# Patient Record
Sex: Male | Born: 1950 | Race: Asian | Marital: Married | Smoking: Never smoker
Health system: Southern US, Community
[De-identification: ages and names within clinical notes are randomized; demographics above are authoritative.]

## PROBLEM LIST (undated history)

## (undated) DIAGNOSIS — I82409 Acute embolism and thrombosis of unspecified deep veins of unspecified lower extremity: Secondary | ICD-10-CM

## (undated) DIAGNOSIS — B1910 Unspecified viral hepatitis B without hepatic coma: Secondary | ICD-10-CM

## (undated) DIAGNOSIS — K769 Liver disease, unspecified: Secondary | ICD-10-CM

## (undated) DIAGNOSIS — E119 Type 2 diabetes mellitus without complications: Secondary | ICD-10-CM

## (undated) DIAGNOSIS — E079 Disorder of thyroid, unspecified: Secondary | ICD-10-CM

## (undated) DIAGNOSIS — I10 Essential (primary) hypertension: Secondary | ICD-10-CM

## (undated) HISTORY — DX: Essential (primary) hypertension: I10

## (undated) HISTORY — PX: THORACENTESIS: SHX235

## (undated) HISTORY — DX: Acute embolism and thrombosis of unspecified deep veins of unspecified lower extremity: I82.409

---

## 2007-12-24 ENCOUNTER — Inpatient Hospital Stay: Payer: Self-pay | Admitting: Endocrinology

## 2012-12-15 ENCOUNTER — Emergency Department: Payer: Self-pay | Admitting: Emergency Medicine

## 2012-12-15 LAB — CBC WITH DIFFERENTIAL/PLATELET
Basophil #: 0.1 10*3/uL (ref 0.0–0.1)
Eosinophil #: 0.1 10*3/uL (ref 0.0–0.7)
HCT: 45.1 % (ref 40.0–52.0)
Lymphocyte #: 1.8 10*3/uL (ref 1.0–3.6)
Lymphocyte %: 22.4 %
MCV: 83 fL (ref 80–100)
Monocyte #: 0.4 x10 3/mm (ref 0.2–1.0)
Monocyte %: 5 %
Neutrophil %: 71.1 %
RBC: 5.41 10*6/uL (ref 4.40–5.90)
RDW: 14.2 % (ref 11.5–14.5)

## 2012-12-15 LAB — COMPREHENSIVE METABOLIC PANEL
Albumin: 3.5 g/dL (ref 3.4–5.0)
Alkaline Phosphatase: 63 U/L
Anion Gap: 4 — ABNORMAL LOW (ref 7–16)
BUN: 23 mg/dL — ABNORMAL HIGH (ref 7–18)
Bilirubin,Total: 1.1 mg/dL — ABNORMAL HIGH (ref 0.2–1.0)
Calcium, Total: 9.1 mg/dL (ref 8.5–10.1)
EGFR (African American): >60
Osmolality: 286 (ref 275–301)
Potassium: 4.3 mmol/L (ref 3.5–5.1)
SGOT(AST): 51 U/L — ABNORMAL HIGH (ref 15–37)
Sodium: 138 mmol/L (ref 136–145)

## 2012-12-15 LAB — TROPONIN I
Troponin-I: <0.02 ng/mL
Troponin-I: <0.02 ng/mL

## 2018-06-22 ENCOUNTER — Other Ambulatory Visit: Payer: Self-pay | Admitting: Family Medicine

## 2018-06-22 DIAGNOSIS — R600 Localized edema: Secondary | ICD-10-CM

## 2018-06-26 ENCOUNTER — Ambulatory Visit
Admission: RE | Admit: 2018-06-26 | Discharge: 2018-06-26 | Disposition: A | Discharge status: Home / Self Care | Payer: Medicare HMO | Source: Ambulatory Visit | Attending: Family Medicine | Admitting: Family Medicine

## 2018-06-26 ENCOUNTER — Encounter (INDEPENDENT_AMBULATORY_CARE_PROVIDER_SITE_OTHER): Payer: Self-pay

## 2018-06-26 ENCOUNTER — Other Ambulatory Visit: Payer: Self-pay

## 2018-06-26 DIAGNOSIS — R600 Localized edema: Secondary | ICD-10-CM | POA: Insufficient documentation

## 2018-09-18 ENCOUNTER — Other Ambulatory Visit: Payer: Self-pay | Admitting: Family Medicine

## 2018-09-18 DIAGNOSIS — R600 Localized edema: Secondary | ICD-10-CM

## 2018-09-27 ENCOUNTER — Other Ambulatory Visit: Payer: Self-pay

## 2018-09-27 ENCOUNTER — Ambulatory Visit
Admission: RE | Admit: 2018-09-27 | Discharge: 2018-09-27 | Disposition: A | Discharge status: Home / Self Care | Payer: Medicare HMO | Source: Ambulatory Visit | Attending: Family Medicine | Admitting: Family Medicine

## 2018-09-27 ENCOUNTER — Encounter (INDEPENDENT_AMBULATORY_CARE_PROVIDER_SITE_OTHER): Payer: Self-pay

## 2018-09-27 DIAGNOSIS — R600 Localized edema: Secondary | ICD-10-CM

## 2018-12-21 ENCOUNTER — Other Ambulatory Visit (HOSPITAL_COMMUNITY): Payer: Self-pay | Admitting: Family Medicine

## 2018-12-21 ENCOUNTER — Other Ambulatory Visit: Payer: Self-pay | Admitting: Family Medicine

## 2018-12-21 DIAGNOSIS — Z86718 Personal history of other venous thrombosis and embolism: Secondary | ICD-10-CM

## 2018-12-31 ENCOUNTER — Encounter (INDEPENDENT_AMBULATORY_CARE_PROVIDER_SITE_OTHER): Payer: Self-pay

## 2018-12-31 ENCOUNTER — Other Ambulatory Visit: Payer: Self-pay

## 2018-12-31 ENCOUNTER — Ambulatory Visit
Admission: RE | Admit: 2018-12-31 | Discharge: 2018-12-31 | Disposition: A | Discharge status: Home / Self Care | Payer: Medicare HMO | Source: Ambulatory Visit | Attending: Family Medicine | Admitting: Family Medicine

## 2018-12-31 DIAGNOSIS — Z86718 Personal history of other venous thrombosis and embolism: Secondary | ICD-10-CM | POA: Insufficient documentation

## 2019-06-11 ENCOUNTER — Other Ambulatory Visit: Payer: Self-pay | Admitting: Family Medicine

## 2019-06-11 ENCOUNTER — Other Ambulatory Visit: Payer: Self-pay

## 2019-06-11 ENCOUNTER — Ambulatory Visit
Admission: RE | Admit: 2019-06-11 | Discharge: 2019-06-11 | Disposition: A | Discharge status: Home / Self Care | Payer: Medicare PPO | Source: Ambulatory Visit | Attending: Family Medicine | Admitting: Family Medicine

## 2019-06-11 DIAGNOSIS — R600 Localized edema: Secondary | ICD-10-CM

## 2019-06-24 ENCOUNTER — Encounter: Payer: Self-pay | Admitting: Emergency Medicine

## 2019-06-24 ENCOUNTER — Other Ambulatory Visit: Payer: Self-pay

## 2019-06-24 ENCOUNTER — Emergency Department
Admission: EM | Admit: 2019-06-24 | Discharge: 2019-06-24 | Disposition: A | Discharge status: Home / Self Care | Payer: Medicare PPO | Attending: Emergency Medicine | Admitting: Emergency Medicine

## 2019-06-24 ENCOUNTER — Emergency Department: Payer: Medicare PPO

## 2019-06-24 DIAGNOSIS — R0602 Shortness of breath: Secondary | ICD-10-CM | POA: Insufficient documentation

## 2019-06-24 DIAGNOSIS — Z5321 Procedure and treatment not carried out due to patient leaving prior to being seen by health care provider: Secondary | ICD-10-CM | POA: Insufficient documentation

## 2019-06-24 LAB — COMPREHENSIVE METABOLIC PANEL
ALT: 76 U/L — ABNORMAL HIGH (ref 0–44)
AST: 91 U/L — ABNORMAL HIGH (ref 15–41)
Albumin: 3.5 g/dL (ref 3.5–5.0)
Alkaline Phosphatase: 103 U/L (ref 38–126)
Anion gap: 9 (ref 5–15)
BUN: 24 mg/dL — ABNORMAL HIGH (ref 8–23)
CO2: 28 mmol/L (ref 22–32)
Calcium: 8.8 mg/dL — ABNORMAL LOW (ref 8.9–10.3)
Chloride: 99 mmol/L (ref 98–111)
Creatinine, Ser: 1.01 mg/dL (ref 0.61–1.24)
GFR calc Af Amer: >60 mL/min (ref >60)
GFR calc non Af Amer: >60 mL/min (ref >60)
Glucose, Bld: 102 mg/dL — ABNORMAL HIGH (ref 70–99)
Potassium: 4 mmol/L (ref 3.5–5.1)
Sodium: 136 mmol/L (ref 135–145)
Total Bilirubin: 1.6 mg/dL — ABNORMAL HIGH (ref 0.3–1.2)
Total Protein: 7.8 g/dL (ref 6.5–8.1)

## 2019-06-24 LAB — CBC
HCT: 40.6 % (ref 39.0–52.0)
Hemoglobin: 13.5 g/dL (ref 13.0–17.0)
MCH: 27.2 pg (ref 26.0–34.0)
MCHC: 33.3 g/dL (ref 30.0–36.0)
MCV: 81.7 fL (ref 80.0–100.0)
Platelets: 242 10*3/uL (ref 150–400)
RBC: 4.97 MIL/uL (ref 4.22–5.81)
RDW: 14.8 % (ref 11.5–15.5)
WBC: 6.1 10*3/uL (ref 4.0–10.5)
nRBC: 0 % (ref 0.0–0.2)

## 2019-06-24 LAB — BRAIN NATRIURETIC PEPTIDE: B Natriuretic Peptide: 116.3 pg/mL — ABNORMAL HIGH (ref 0.0–100.0)

## 2019-06-24 NOTE — ED Notes (Signed)
No answer when called from lobby x1 

## 2019-06-24 NOTE — ED Notes (Signed)
No answer when called from lobby x3. 

## 2019-06-24 NOTE — ED Triage Notes (Signed)
Patient presents to the ED with shortness of breath on exertion x 1 week.  Patient states he was started on Eliquis and Vimlidy around the same time the symptoms started due to a "possible" blood clot to patient's left leg.

## 2019-06-24 NOTE — ED Notes (Signed)
No answer when called from lobby x2. 

## 2019-06-26 ENCOUNTER — Telehealth: Payer: Self-pay | Admitting: Emergency Medicine

## 2019-06-26 NOTE — Telephone Encounter (Signed)
Called patient due to lwot to inquire about condition and follow up plans. Says she has called her doctor and will follow up.

## 2019-09-12 ENCOUNTER — Other Ambulatory Visit: Payer: Self-pay

## 2019-09-12 ENCOUNTER — Encounter: Payer: Self-pay | Admitting: Emergency Medicine

## 2019-09-12 DIAGNOSIS — T85631A Leakage of intraperitoneal dialysis catheter, initial encounter: Secondary | ICD-10-CM | POA: Insufficient documentation

## 2019-09-12 DIAGNOSIS — K649 Unspecified hemorrhoids: Secondary | ICD-10-CM | POA: Diagnosis present

## 2019-09-12 DIAGNOSIS — Y69 Unspecified misadventure during surgical and medical care: Secondary | ICD-10-CM | POA: Insufficient documentation

## 2019-09-12 DIAGNOSIS — I10 Essential (primary) hypertension: Secondary | ICD-10-CM | POA: Insufficient documentation

## 2019-09-12 LAB — COMPREHENSIVE METABOLIC PANEL
ALT: 60 U/L — ABNORMAL HIGH (ref 0–44)
AST: 79 U/L — ABNORMAL HIGH (ref 15–41)
Albumin: 3.7 g/dL (ref 3.5–5.0)
Alkaline Phosphatase: 109 U/L (ref 38–126)
Anion gap: 11 (ref 5–15)
BUN: 30 mg/dL — ABNORMAL HIGH (ref 8–23)
CO2: 29 mmol/L (ref 22–32)
Calcium: 9 mg/dL (ref 8.9–10.3)
Chloride: 89 mmol/L — ABNORMAL LOW (ref 98–111)
Creatinine, Ser: 1 mg/dL (ref 0.61–1.24)
GFR calc Af Amer: >60 mL/min (ref >60)
GFR calc non Af Amer: >60 mL/min (ref >60)
Glucose, Bld: 341 mg/dL — ABNORMAL HIGH (ref 70–99)
Potassium: 4 mmol/L (ref 3.5–5.1)
Sodium: 129 mmol/L — ABNORMAL LOW (ref 135–145)
Total Bilirubin: 1.6 mg/dL — ABNORMAL HIGH (ref 0.3–1.2)
Total Protein: 8.1 g/dL (ref 6.5–8.1)

## 2019-09-12 LAB — TYPE AND SCREEN
ABO/RH(D): AB POS
Antibody Screen: NEGATIVE

## 2019-09-12 LAB — CBC
HCT: 35.4 % — ABNORMAL LOW (ref 39.0–52.0)
Hemoglobin: 12.3 g/dL — ABNORMAL LOW (ref 13.0–17.0)
MCH: 27.8 pg (ref 26.0–34.0)
MCHC: 34.7 g/dL (ref 30.0–36.0)
MCV: 79.9 fL — ABNORMAL LOW (ref 80.0–100.0)
Platelets: 228 10*3/uL (ref 150–400)
RBC: 4.43 MIL/uL (ref 4.22–5.81)
RDW: 16.1 % — ABNORMAL HIGH (ref 11.5–15.5)
WBC: 7.1 10*3/uL (ref 4.0–10.5)
nRBC: 0 % (ref 0.0–0.2)

## 2019-09-12 NOTE — ED Triage Notes (Signed)
Pt in via EMS from home with c/o bright red blood in his BM. Pt with ESRD. Pt had parecntesis on Tuesday and has had leakage from it for 48 hours  127/69 98 HR  100%RA FSBS 354 by home reading and took 8 units of insulin prior to leaving the scene

## 2019-09-12 NOTE — ED Triage Notes (Addendum)
Pt from home via [facility]. Per EMS, pt st going to the bathroom today and noticed bright red blood in his stool. St having a paracentesis on Tuesday 9/7.   St having same procedure about 3 times but this time he noticed a constant leakage noted from left mid abd, st calling PCP and told leakage should go away within 48hrs.  Pt on eloquis.   Pt denies Cp/SHOB/dizziness at this time.  NAD noted at this time.

## 2019-09-13 ENCOUNTER — Emergency Department
Admission: EM | Admit: 2019-09-13 | Discharge: 2019-09-13 | Disposition: A | Discharge status: Home / Self Care | Payer: Medicare PPO | Attending: Emergency Medicine | Admitting: Emergency Medicine

## 2019-09-13 DIAGNOSIS — K649 Unspecified hemorrhoids: Secondary | ICD-10-CM

## 2019-09-13 LAB — GLUCOSE, CAPILLARY: Glucose-Capillary: 302 mg/dL — ABNORMAL HIGH (ref 70–99)

## 2019-09-13 MED ORDER — LIDOCAINE HCL (PF) 1 % IJ SOLN
INTRAMUSCULAR | Status: AC
  Filled 2019-09-13: qty 5

## 2019-09-13 NOTE — ED Provider Notes (Signed)
[facility] Emergency Department Provider Note  ____________________________________________   First MD Initiated Contact with Patient 09/13/19 0127     (approximate)  I have reviewed the triage vital signs and the nursing notes.   HISTORY  Chief Complaint GI Bleeding (Leakage from paracentesis)    HPI Keith Salazar is a 69 y.o. male with below list of previous medical conditions including hypertension DVT end-stage renal disease recent paracentesis on Tuesday presents to the emergency department secondary to 2 complaints.  Patient states that he has had persistent leakage of fluid from the site of his paracentesis and episode of bright red blood per rectum with defecation today.  Patient denies any abdominal discomfort.  Patient denies any fever afebrile on presentation.        Past Medical History:  Diagnosis Date  . DVT (deep venous thrombosis) (HCC)   . Hypertension     There are no problems to display for this patient.   History reviewed. No pertinent surgical history.  Prior to Admission medications   Not on File    Allergies Patient has no known allergies.  History reviewed. No pertinent family history.  Social History Social History   Tobacco Use  . Smoking status: Never Smoker  . Smokeless tobacco: Never Used  Vaping Use  . Vaping Use: Never used  Substance Use Topics  . Alcohol use: Not Currently  . Drug use: Not on file    Review of Systems Constitutional: No fever/chills Eyes: No visual changes. ENT: No sore throat. Cardiovascular: Denies chest pain. Respiratory: Denies shortness of breath. Gastrointestinal: No abdominal pain.  No nausea, no vomiting.  No diarrhea.  No constipation. Genitourinary: Negative for dysuria. Musculoskeletal: Negative for neck pain.  Negative for back pain. Integumentary: Negative for rash. Neurological: Negative for headaches, focal weakness or  numbness.  ____________________________________________   PHYSICAL EXAM:  VITAL SIGNS: ED Triage Vitals  Enc Vitals Group     BP 09/12/19 1812 112/71     Pulse Rate 09/12/19 1812 89     Resp 09/12/19 1812 18     Temp 09/12/19 1812 98 F (36.7 C)     Temp Source 09/12/19 1812 Oral     SpO2 09/12/19 1812 100 %     Weight 09/12/19 1813 56.7 kg (125 lb)     Height 09/12/19 1813 1.676 m (5\' 6" )     Head Circumference --      Peak Flow --      Pain Score 09/12/19 1813 0     Pain Loc --      Pain Edu? --      Excl. in GC? --     Constitutional: Alert and oriented.  Eyes: Conjunctivae are normal.  Head: Atraumatic. Mouth/Throat: Patient is wearing a mask. Neck: No stridor.  No meningeal signs.   Cardiovascular: Normal rate, regular rhythm. Good peripheral circulation. Grossly normal heart sounds. Respiratory: Normal respiratory effort.  No retractions. Gastrointestinal: Soft and nontender. No distention.  Small puncture site noted left lower quadrant with ascites fluid draining slowly from site Rectal: Hemorrhoids noted at 5 and 7 o'clock position with evidence of bleeding from the 5 o'clock position hemorrhoid Musculoskeletal: No lower extremity tenderness nor edema. No gross deformities of extremities. Neurologic:  Normal speech and language. No gross focal neurologic deficits are appreciated.  Skin:  Skin is warm, dry and intact. Psychiatric: Mood and affect are normal. Speech and behavior are normal.  ____________________________________________   LABS (all labs ordered are listed,  but only abnormal results are displayed)  Labs Reviewed  COMPREHENSIVE METABOLIC PANEL - Abnormal; Notable for the following components:      Result Value   Sodium 129 (*)    Chloride 89 (*)    Glucose, Bld 341 (*)    BUN 30 (*)    AST 79 (*)    ALT 60 (*)    Total Bilirubin 1.6 (*)    All other components within normal limits  CBC - Abnormal; Notable for the following components:    Hemoglobin 12.3 (*)    HCT 35.4 (*)    MCV 79.9 (*)    RDW 16.1 (*)    All other components within normal limits  GLUCOSE, CAPILLARY - Abnormal; Notable for the following components:   Glucose-Capillary 302 (*)    All other components within normal limits  POC OCCULT BLOOD, ED  TYPE AND SCREEN   ____________________________________________   .Marland KitchenLaceration Repair  Date/Time: 09/13/2019 4:19 AM Performed by: Darci Current, MD Authorized by: Darci Current, MD   Consent:    Consent obtained:  Verbal   Consent given by:  Patient   Risks discussed:  Infection, pain, retained foreign body, poor cosmetic result and poor wound healing Anesthesia (see MAR for exact dosages):    Anesthesia method:  Local infiltration   Local anesthetic:  Lidocaine 1% w/o epi Laceration details:    Location:  Trunk   Trunk location:  LLQ abd   Length (cm):  1 Repair type:    Repair type:  Simple Exploration:    Hemostasis achieved with:  Direct pressure   Wound exploration: entire depth of wound probed and visualized     Contaminated: no   Treatment:    Area cleansed with:  Saline   Amount of cleaning:  Extensive   Irrigation solution:  Sterile saline   Visualized foreign bodies/material removed: no   Skin repair:    Repair method:  Sutures   Suture size:  6-0   Suture technique:  Simple interrupted Approximation:    Approximation:  Close Post-procedure details:    Dressing:  Sterile dressing   Patient tolerance of procedure:  Tolerated well, no immediate complications     ____________________________________________   INITIAL IMPRESSION / MDM / ASSESSMENT AND PLAN / ED COURSE  As part of my medical decision making, I reviewed the following data within the electronic MEDICAL RECORD NUMBER   69 year old male presented with above-stated history and physical exam consistent with external hemorrhoid.  Regarding the patient's continuous drip leakage of ascites fluid from recent  paracentesis site.  Attempted to glue the area however leakage continued.  As such to stitches were placed with complete resolution of leakage of fluid. ____________________________________________  FINAL CLINICAL IMPRESSION(S) / ED DIAGNOSES  Final diagnoses:  Hemorrhoids, unspecified hemorrhoid type  Wound repair   MEDICATIONS GIVEN DURING THIS VISIT:  Medications - No data to display   ED Discharge Orders    None      *Please note:  Posey Mish was evaluated in Emergency Department on 09/13/2019 for the symptoms described in the history of present illness. He was evaluated in the context of the global COVID-19 pandemic, which necessitated consideration that the patient might be at risk for infection with the SARS-CoV-2 virus that causes COVID-19. Institutional protocols and algorithms that pertain to the evaluation of patients at risk for COVID-19 are in a state of rapid change based on information released by regulatory bodies including the CDC and federal and  state organizations. These policies and algorithms were followed during the patient's care in the ED.  Some ED evaluations and interventions may be delayed as a result of limited staffing during and after the pandemic.*  Note:  This document was prepared using Dragon voice recognition software and may include unintentional dictation errors.   Darci Current, MD 09/13/19 [phone]

## 2021-02-25 ENCOUNTER — Ambulatory Visit
Admission: EM | Admit: 2021-02-25 | Discharge: 2021-02-25 | Disposition: A | Discharge status: Home / Self Care | Payer: Medicare HMO | Attending: Emergency Medicine | Admitting: Emergency Medicine

## 2021-02-25 ENCOUNTER — Ambulatory Visit (INDEPENDENT_AMBULATORY_CARE_PROVIDER_SITE_OTHER): Payer: Medicare HMO

## 2021-02-25 ENCOUNTER — Other Ambulatory Visit: Payer: Self-pay

## 2021-02-25 DIAGNOSIS — R079 Chest pain, unspecified: Secondary | ICD-10-CM

## 2021-02-25 DIAGNOSIS — R0600 Dyspnea, unspecified: Secondary | ICD-10-CM | POA: Diagnosis not present

## 2021-02-25 DIAGNOSIS — R1084 Generalized abdominal pain: Secondary | ICD-10-CM

## 2021-02-25 DIAGNOSIS — R0602 Shortness of breath: Secondary | ICD-10-CM

## 2021-02-25 HISTORY — DX: Type 2 diabetes mellitus without complications: E11.9

## 2021-02-25 HISTORY — DX: Unspecified viral hepatitis B without hepatic coma: B19.10

## 2021-02-25 HISTORY — DX: Liver disease, unspecified: K76.9

## 2021-02-25 HISTORY — DX: Disorder of thyroid, unspecified: E07.9

## 2021-02-25 NOTE — ED Triage Notes (Signed)
Patient presents to UC for SOB since this morning. Abdominal pain x 2 weeks. Pt states he has a hx of DVT on eliquis. He reports swelling in abdomen. Last thoracentesis in April 2022.  Denies chest pain or arm pain.

## 2021-02-25 NOTE — Discharge Instructions (Addendum)
Expressed to patient he should follow-up and call Juel Burrow today upon discharge or he will need to be seen in the emergency room.  There are concerns that fluid is accumulating to the abdominal area. Chest x-ray is negative.  Patient is stable at this time but does need further evaluation and a work-up completed with labs. Patient can speak in full sentences and is not in distress or shortness of breath for him to contact the provider.  Patient understands the discharge instructions and will need to be seen in the emergency room if symptoms become worse. Final Clinical Impressions(s) / UC Diagnoses

## 2021-02-25 NOTE — ED Provider Notes (Addendum)
[facility]    CSN: [id number] Arrival date & time: 02/25/21  1039      History   Chief Complaint Chief Complaint  Patient presents with   Abdominal Pain   Shortness of Breath    HPI Keith Salazar is a 71 y.o. male.   Patient presents today with left-sided chest pain and some shortness of breath upon exertion.  Has had some abdominal pain and distention for 2 weeks now.  Patient has a history of hepatitis B and has a shunt placed for fluid.  Has not had paracentesis done since May of last year.  Patient denies any leg swelling.  Denies any fevers no nausea vomiting or diarrhea.  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  Patient is on blood thinners at this time.   Past Medical History:  Diagnosis Date   Diabetes mellitus, type 2 (HCC)    DVT (deep venous thrombosis) (HCC)    Hepatitis B    Hypertension    Liver disease    Thyroid disease     There are no problems to display for this patient.   Past Surgical History:  Procedure Laterality Date   THORACENTESIS         Home Medications    Prior to Admission medications   Medication Sig Start Date End Date Taking? Authorizing Provider  insulin aspart (NOVOLOG) 100 UNIT/ML FlexPen Inject into the skin. 01/07/21 06/16/21 Yes [provider]  ELIQUIS 5 MG TABS tablet Take 5 mg by mouth 2 (two) times daily. 12/24/20   [provider]  furosemide (LASIX) 40 MG tablet Take 40 mg by mouth daily. 12/10/20   [provider]  glipiZIDE (GLUCOTROL) 5 MG tablet Take 5 mg by mouth every morning. 01/30/21   [provider]  lactulose (CHRONULAC) 10 GM/15ML solution Take 20 g by mouth daily. 01/04/21   [provider]  levothyroxine (SYNTHROID) 50 MCG tablet Take 50 mcg by mouth daily. 01/29/21   [provider]  spironolactone (ALDACTONE) 50 MG tablet Take 50 mg by mouth daily. 01/30/21   [provider]  thiamine 100 MG tablet Take by mouth.    [provider]   TRADJENTA 5 MG TABS tablet Take 5 mg by mouth daily. 02/20/21   [provider]  ursodiol (ACTIGALL) 250 MG tablet Take by mouth. 12/17/20   [provider]  VEMLIDY 25 MG TABS Take 1 tablet by mouth daily. 02/03/21   [provider]    Family History History reviewed. No pertinent family history.  Social History Social History   Tobacco Use   Smoking status: Never   Smokeless tobacco: Never  Vaping Use   Vaping Use: Never used  Substance Use Topics   Alcohol use: Not Currently   Drug use: Never     Allergies   Patient has no known allergies.   Review of Systems Review of Systems  Constitutional:  Positive for fatigue. Negative for fever.  HENT: Negative.    Respiratory:  Positive for shortness of breath. Negative for cough and wheezing.   Cardiovascular:  Positive for chest pain. Negative for palpitations and leg swelling.  Gastrointestinal:  Positive for abdominal distention and abdominal pain. Negative for constipation, nausea and vomiting.  Endocrine: Negative.   Genitourinary: Negative.   Skin: Negative.   Neurological: Negative.     Physical Exam Triage Vital Signs ED Triage Vitals  Enc Vitals Group     BP 02/25/21 1050 (!) 142/79  Pulse Rate 02/25/21 1050 78     Resp 02/25/21 1050 16     Temp 02/25/21 1050 98.3 F (36.8 C)     Temp Source 02/25/21 1050 Oral     SpO2 02/25/21 1050 99 %     Weight --      Height --      Head Circumference --      Peak Flow --      Pain Score 02/25/21 1100 0     Pain Loc --      Pain Edu? --      Excl. in GC? --    No data found.  Updated Vital Signs BP (!) 142/79 (BP Location: Right Arm)    Pulse 78    Temp 98.3 F (36.8 C) (Oral)    Resp 16    SpO2 99%   Visual Acuity Right Eye Distance:   Left Eye Distance:   Bilateral Distance:    Right Eye Near:   Left Eye Near:    Bilateral Near:     Physical Exam Constitutional:      Appearance: He is well-developed and normal weight.   Cardiovascular:     Rate and Rhythm: Normal rate.  Pulmonary:     Effort: Pulmonary effort is normal.     Breath sounds: Normal breath sounds.  Abdominal:     General: Bowel sounds are normal. There is distension.     Palpations: Abdomen is soft.     Tenderness: There is generalized abdominal tenderness. There is no right CVA tenderness or left CVA tenderness.     Comments: Abdomen slight distention.  Soft to palpation may be concerns for fluid to abdomen.  Patient unsure of baseline  Skin:    General: Skin is warm.     Capillary Refill: Capillary refill takes 2 to 3 seconds.  Neurological:     General: No focal deficit present.     Mental Status: He is alert.     UC Treatments / Results  Labs (all labs ordered are listed, but only abnormal results are displayed) Labs Reviewed - No data to display  EKG   Radiology DG Chest 2 View  Result Date: 02/25/2021 CLINICAL DATA:  Chest pain, shortness of breath, DVT, hepatitis-B, abdominal pain for 2 weeks, on Eliquis for DVT, abdominal swelling, history of thoracentesis in April 2022 EXAM: CHEST - 2 VIEW COMPARISON:  06/24/2019 FINDINGS: Normal heart size, mediastinal contours, and pulmonary vascularity. Atherosclerotic calcification aorta. Lungs clear. No pulmonary infiltrate, pleural effusion, or pneumothorax. Tips shunt RIGHT upper quadrant. Bones demineralized. IMPRESSION: No acute abnormalities. Aortic Atherosclerosis (ICD10-I70.0). Electronically Signed   By: Ulyses Southward M.D.   On: 02/25/2021 11:40    Procedures ED EKG  Date/Time: 02/25/2021 12:01 PM Performed by: Coralyn Mark, NP Authorized by: Coralyn Mark, NP   ECG reviewed by ED Physician in the absence of a cardiologist: no   Previous ECG:    Previous ECG:  Unavailable Interpretation:    Interpretation: normal   Rate:    ECG rate assessment: normal   Rhythm:    Rhythm: sinus rhythm   ST segments:    ST segments:  Normal T waves:    T waves: normal    Q waves:    Abnormal Q-waves: present   (including critical care time)  Medications Ordered in UC Medications - No data to display  Initial Impression / Assessment and Plan / UC Course  I have reviewed the triage vital signs  and the nursing notes.  Pertinent labs & imaging results that were available during my care of the patient were reviewed by me and considered in my medical decision making (see chart for details).     Expressed to patient he should follow-up and call Juel Burrow today upon discharge or he will need to be seen in the emergency room.  There are concerns that fluid is accumulating to the abdominal area. Chest x-ray is negative.  Patient is stable at this time but does need further evaluation and a work-up completed with labs. Patient can speak in full sentences and is not in distress or shortness of breath for him to contact the provider.  Patient understands the discharge instructions and will need to be seen in the emergency room if symptoms become worse. Final Clinical Impressions(s) / UC Diagnoses   Final diagnoses:  Chest pain, unspecified type  Generalized abdominal pain  Dyspnea, unspecified type     Discharge Instructions      Expressed to patient he should follow-up and call Juel Burrow today upon discharge or he will need to be seen in the emergency room.  There are concerns that fluid is accumulating to the abdominal area. Chest x-ray is negative.  Patient is stable at this time but does need further evaluation and a work-up completed with labs. Patient can speak in full sentences and is not in distress or shortness of breath for him to contact the provider.  Patient understands the discharge instructions and will need to be seen in the emergency room if symptoms become worse. Final Clinical Impressions(s) / UC Diagnoses       ED Prescriptions   None    PDMP not reviewed this encounter.   Coralyn Mark, NP 02/25/21 1200    Coralyn Mark,  NP 02/25/21 1202    Coralyn Mark, NP 02/25/21 1204

## 2021-02-25 NOTE — ED Notes (Signed)
Provider at beside.

## 2021-10-18 IMAGING — US US EXTREM LOW VENOUS*L*
1 series · 13 of 24 positions shown · non-contrast
Comparison: 09/27/2018

CLINICAL DATA: 68-year-old male with DVT



[Series 1: us extrem low venous*left* · 0.07mm/px · 13 of 33 slices shown]
[im 1/33]
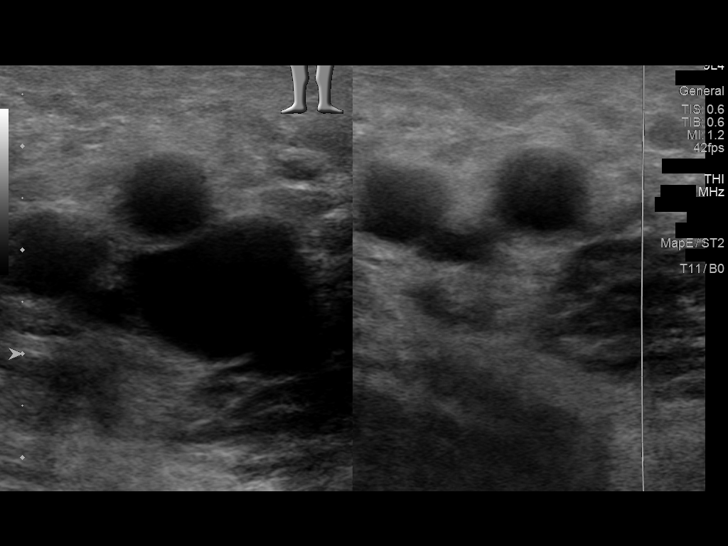
[im 3/33]
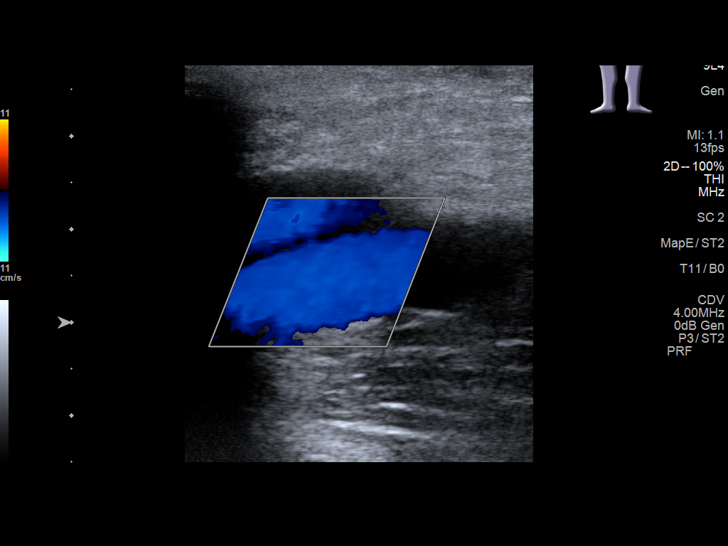
[im 6/33]
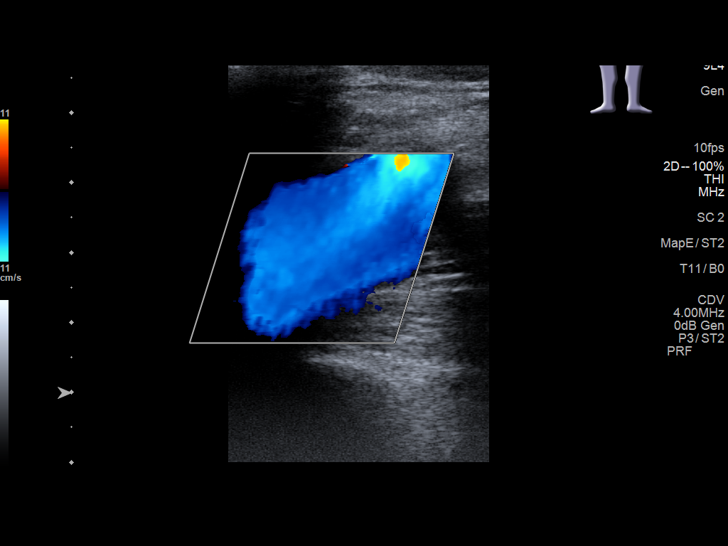
[im 9/33]
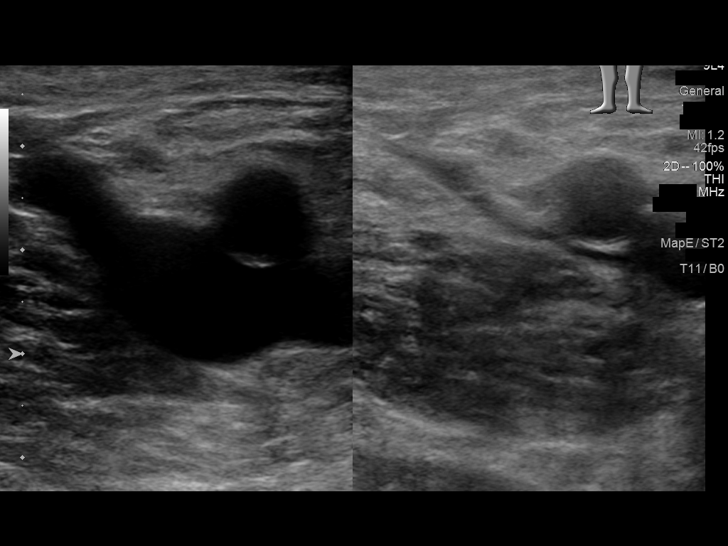
[im 12/33]
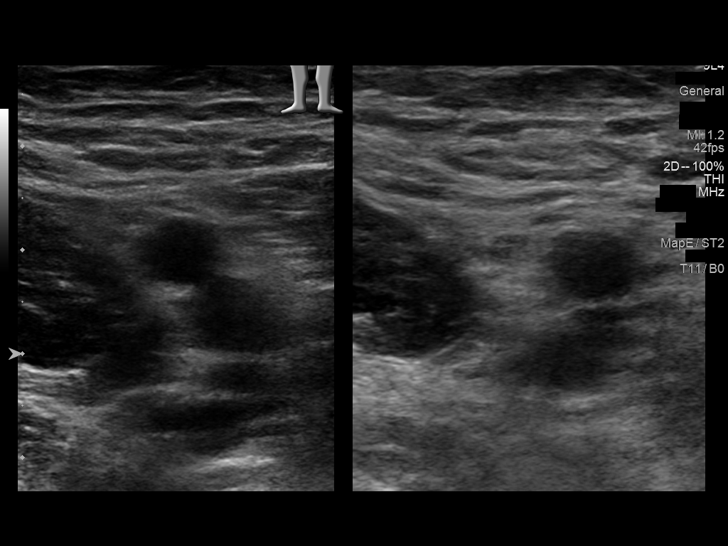
[im 14/33]
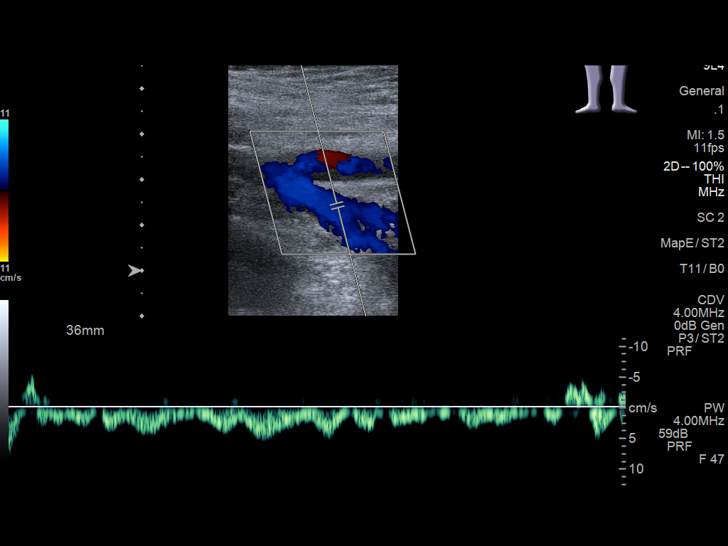
[im 17/33]
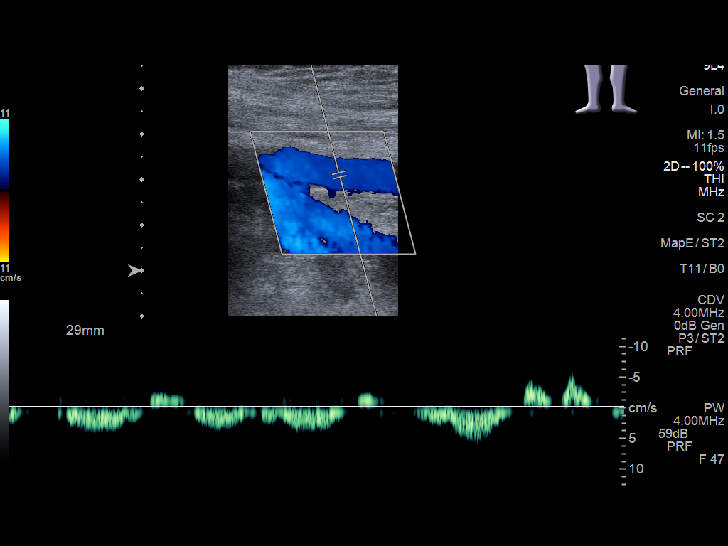
[im 19/33]
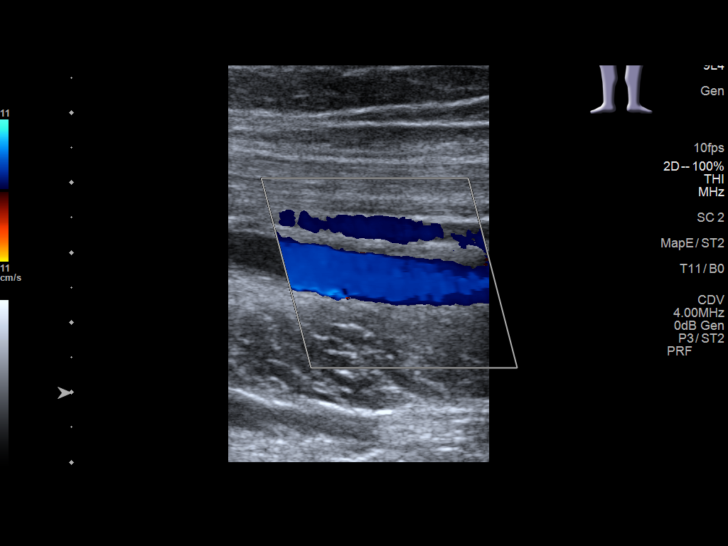
[im 21/33]
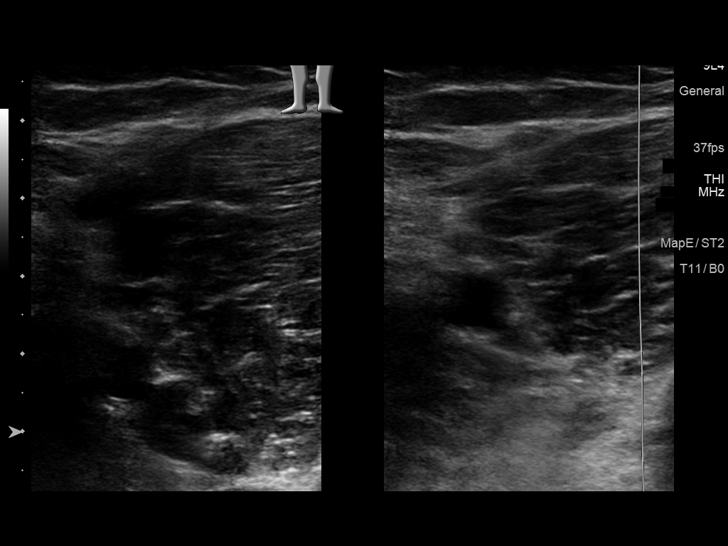
[im 24/33]
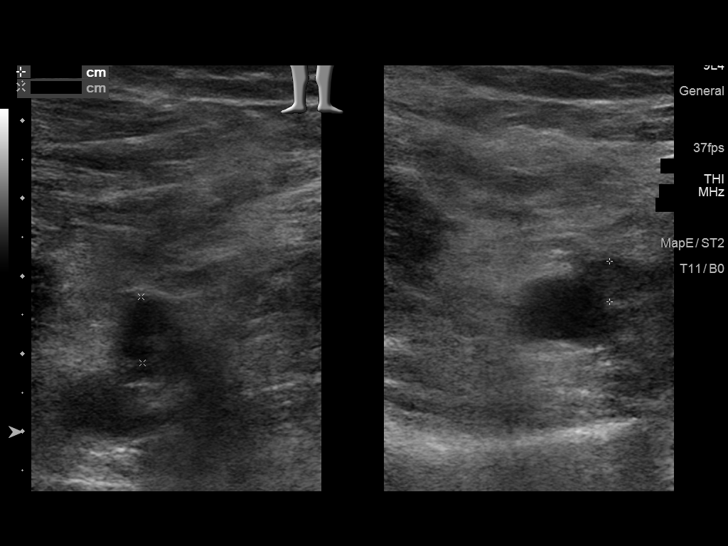
[im 27/33]
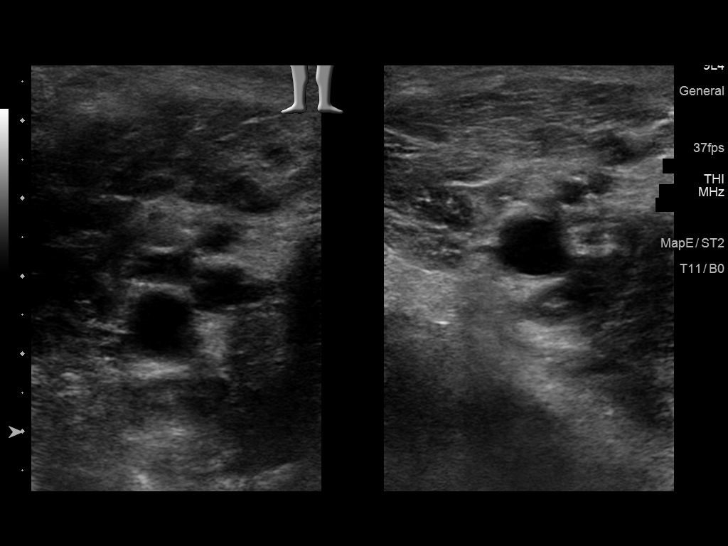
[im 30/33]
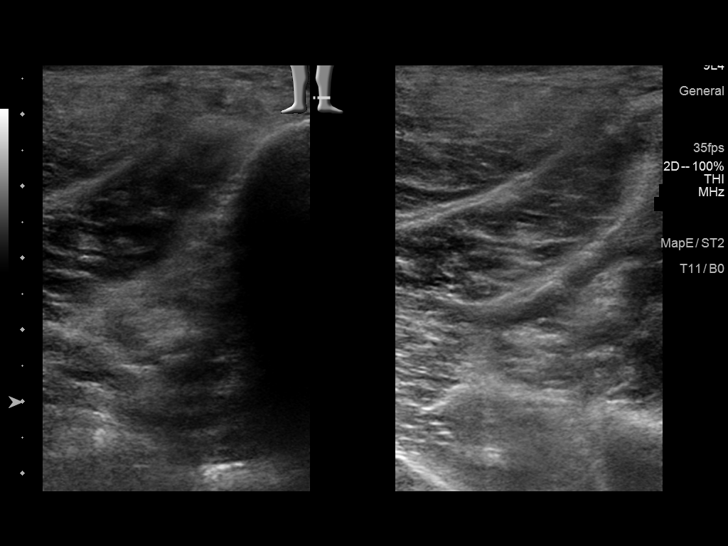
[im 33/33]
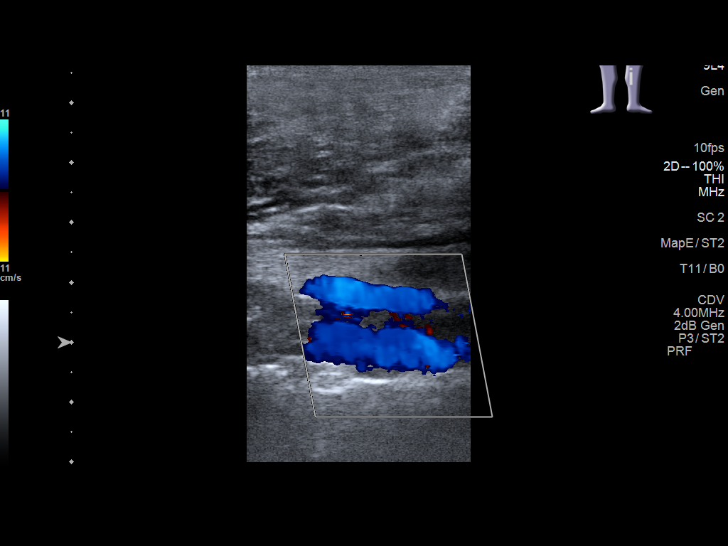

[13 of 24 positions shown; findings below may reference images not displayed]

FINDINGS: Contralateral Common Femoral Vein: Respiratory phasicity is normal
and symmetric with the symptomatic side. No evidence of thrombus.
Normal compressibility.

Common Femoral Vein: No evidence of thrombus. Normal
compressibility, respiratory phasicity and response to augmentation.

Saphenofemoral Junction: No evidence of thrombus. Normal
compressibility and flow on color Doppler imaging.

Profunda Femoral Vein: No evidence of thrombus. Normal
compressibility and flow on color Doppler imaging.

Femoral Vein: No evidence of thrombus. Normal compressibility,
respiratory phasicity and response to augmentation.

Popliteal Vein: Partially compressible popliteal vein. Flow
maintained with wall thickening.

Calf Veins: No evidence of thrombus. Normal compressibility and flow
on color Doppler imaging.

Superficial Great Saphenous Vein: No evidence of thrombus. Normal
compressibility and flow on color Doppler imaging.

Other Findings:  None.
IMPRESSION: Sonographic survey of the left lower extremity negative for acute
proximal DVT.

Sonographic survey demonstrates chronic changes of the popliteal
vein which remains patent.

## 2023-02-04 DEATH — deceased

## 2023-12-14 IMAGING — CR DG CHEST 2V
2 series · 2 of 2 positions shown · non-contrast
Comparison: 06/24/2019

CLINICAL DATA: Chest pain, shortness of breath, DVT, hepatitis-B,
abdominal pain for 2 weeks, on Eliquis for DVT, abdominal swelling,
history of thoracentesis in April 2020

EXAM:
CHEST - 2 VIEW

[chest pa]
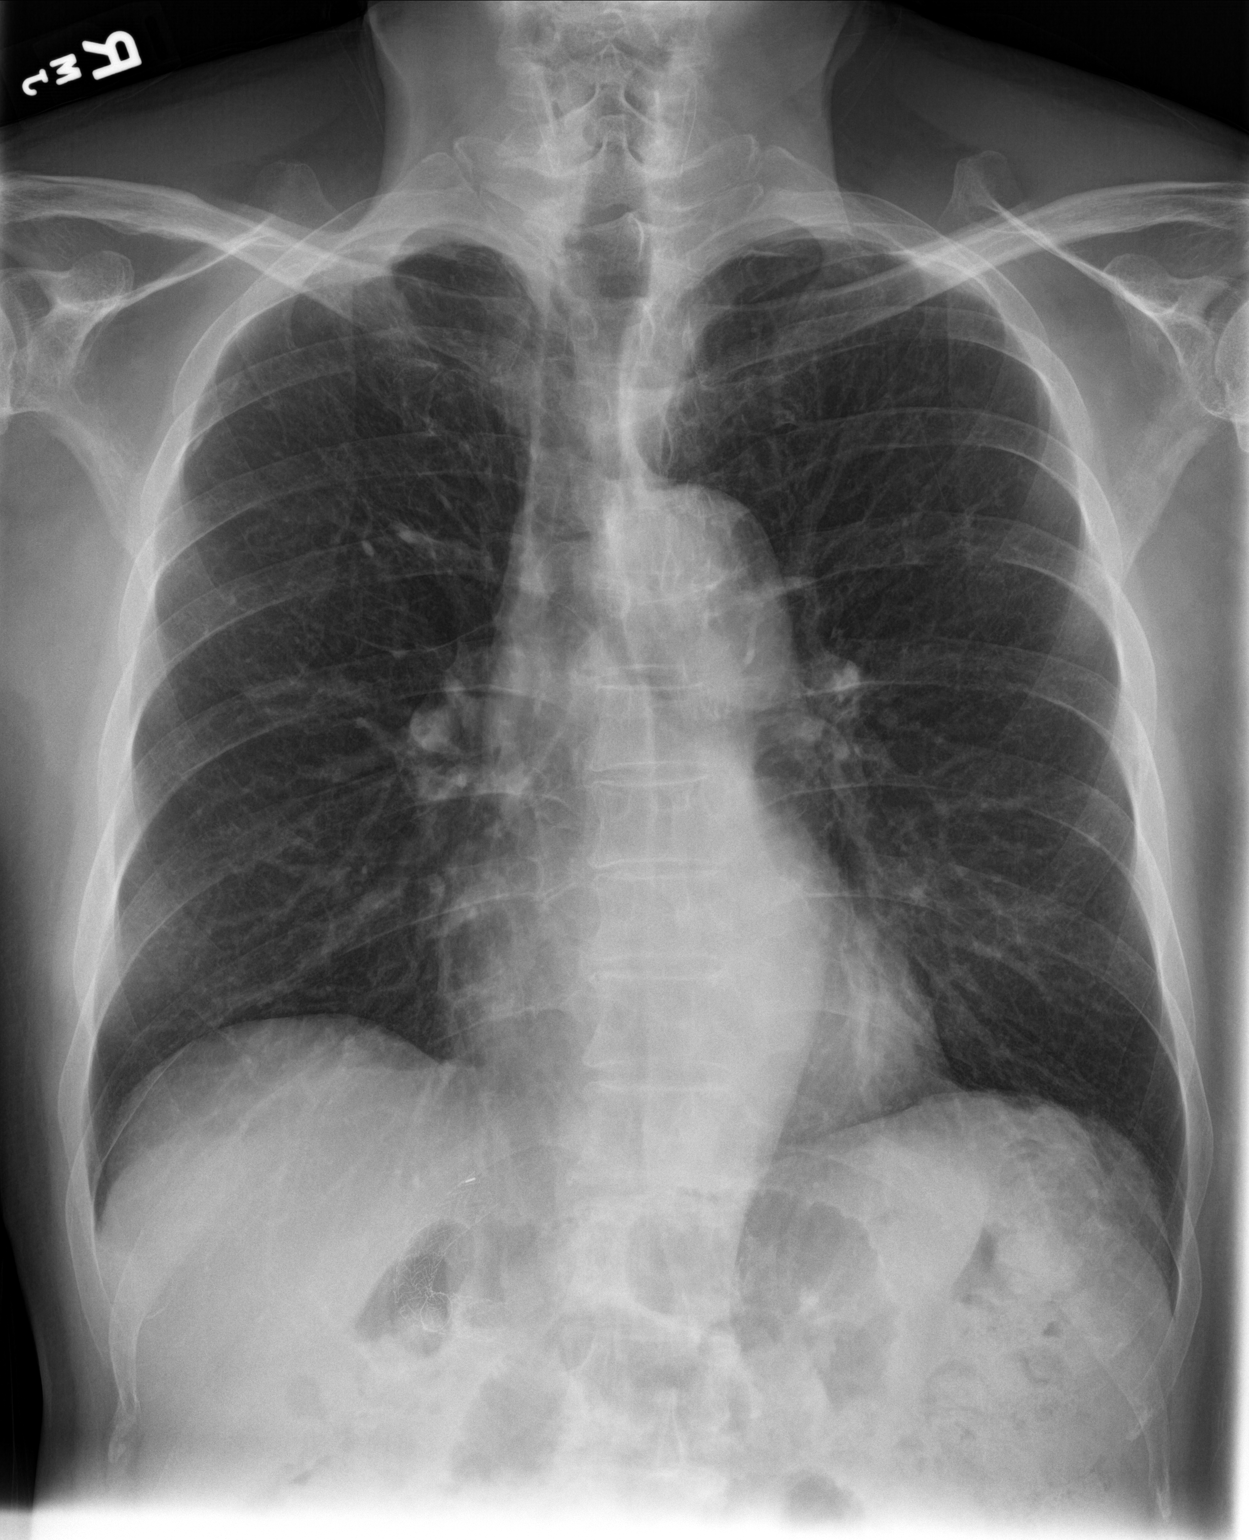

[chest lat]
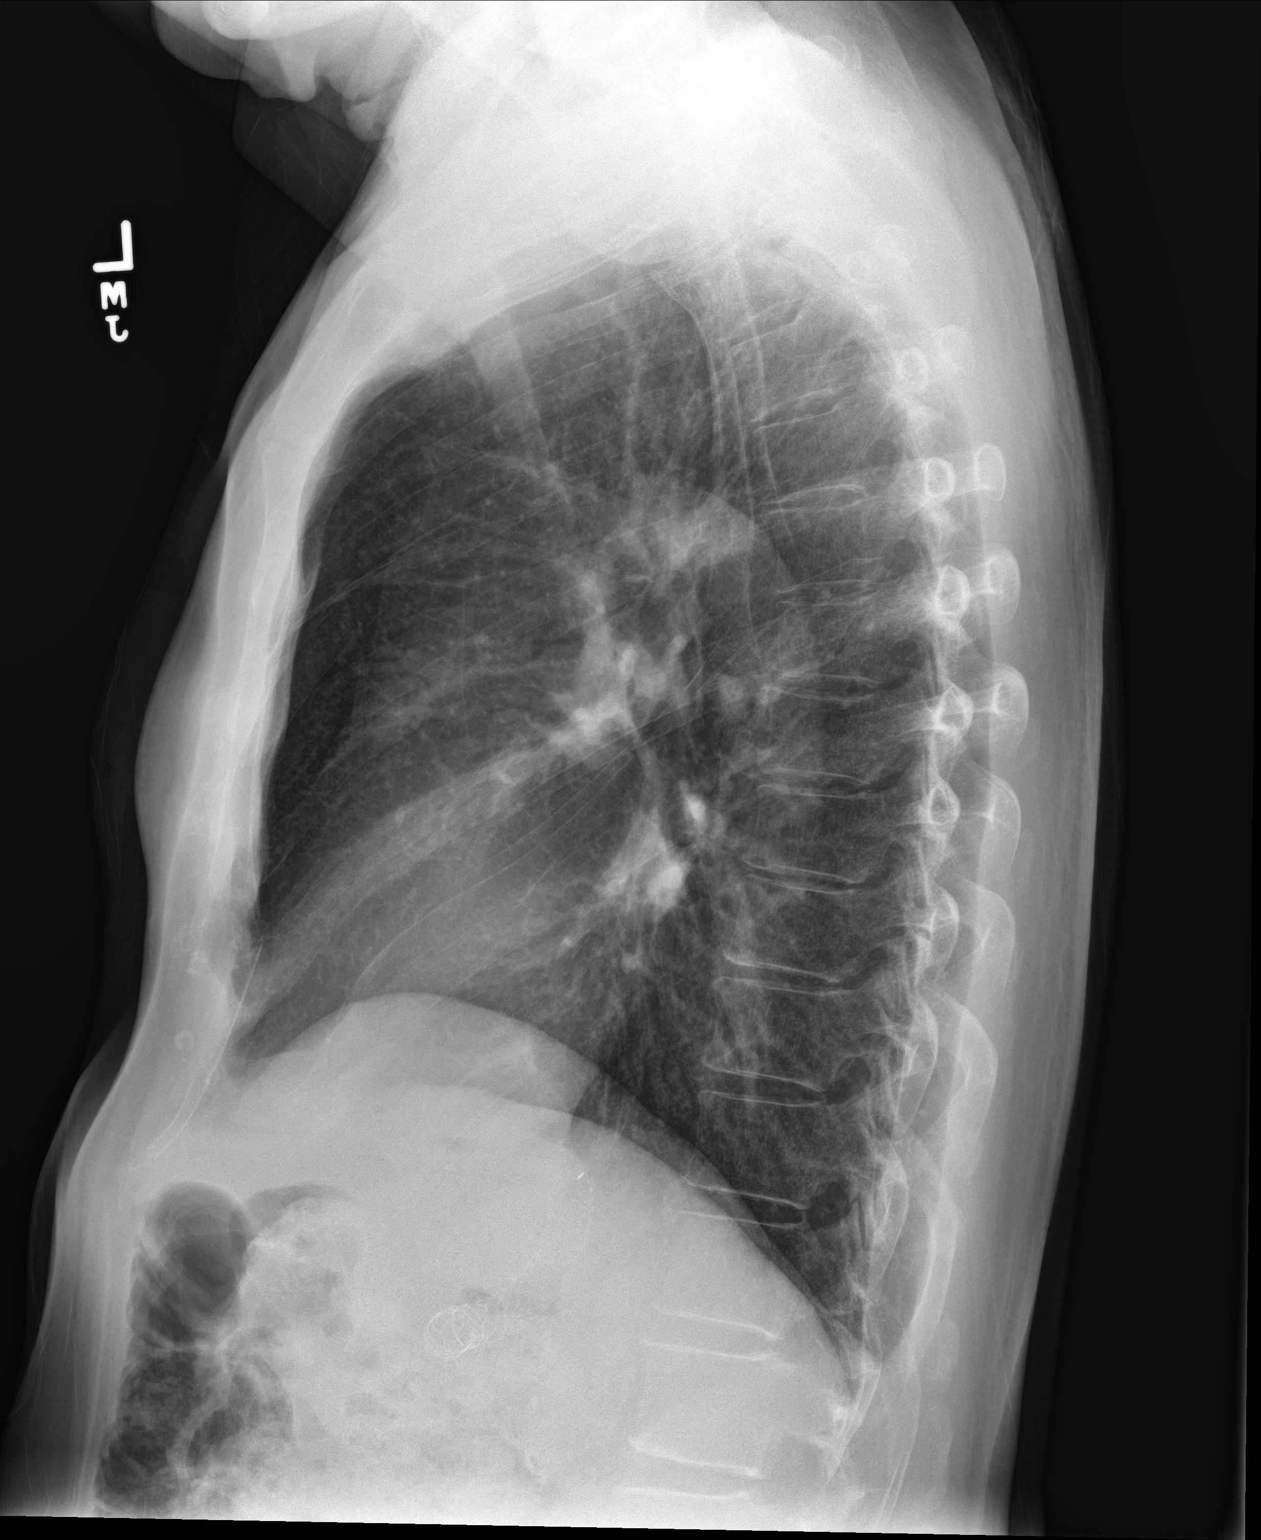

[2 of 2 positions shown; findings below may reference images not displayed]

FINDINGS: Normal heart size, mediastinal contours, and pulmonary vascularity.

Atherosclerotic calcification aorta.

Lungs clear.

No pulmonary infiltrate, pleural effusion, or pneumothorax.

Tips shunt RIGHT upper quadrant.

Bones demineralized.
IMPRESSION: No acute abnormalities.

Aortic Atherosclerosis ([id number]).
